# Patient Record
Sex: Female | Born: 1950 | Race: White | Hispanic: No | Marital: Married | Smoking: Never smoker
Health system: Southern US, Community
[De-identification: ages and names within clinical notes are randomized; demographics above are authoritative.]

## PROBLEM LIST (undated history)

## (undated) DIAGNOSIS — I10 Essential (primary) hypertension: Secondary | ICD-10-CM

## (undated) DIAGNOSIS — K219 Gastro-esophageal reflux disease without esophagitis: Secondary | ICD-10-CM

## (undated) DIAGNOSIS — E079 Disorder of thyroid, unspecified: Secondary | ICD-10-CM

## (undated) DIAGNOSIS — F419 Anxiety disorder, unspecified: Secondary | ICD-10-CM

## (undated) HISTORY — PX: KNEE SURGERY: SHX244

## (undated) HISTORY — PX: CHOLECYSTECTOMY: SHX55

---

## 2017-08-22 ENCOUNTER — Other Ambulatory Visit: Payer: Self-pay | Admitting: Internal Medicine

## 2017-08-22 DIAGNOSIS — R921 Mammographic calcification found on diagnostic imaging of breast: Secondary | ICD-10-CM

## 2017-08-29 ENCOUNTER — Ambulatory Visit
Admission: RE | Admit: 2017-08-29 | Discharge: 2017-08-29 | Disposition: A | Discharge status: Home / Self Care | Payer: Medicare Other | Source: Ambulatory Visit | Attending: Internal Medicine | Admitting: Internal Medicine

## 2017-08-29 DIAGNOSIS — R921 Mammographic calcification found on diagnostic imaging of breast: Secondary | ICD-10-CM

## 2017-08-30 ENCOUNTER — Telehealth: Payer: Self-pay | Admitting: *Deleted

## 2017-08-30 ENCOUNTER — Encounter: Payer: Self-pay | Admitting: Internal Medicine

## 2017-08-30 NOTE — Telephone Encounter (Signed)
-----   Message from Inez CatalinaEmily B Mullen, MD sent at 08/30/2017  3:16 PM EST ----- Myriam JacobsonHelen - - Epic seems to indicate that [facility].    This result came to us inappropriately as this patient has not seen us.  I would like to make sure her PCP gets the result faxed to them.  Can you find out if it faxed and if not, fax again and confirm?    Thank you!

## 2017-08-30 NOTE — Telephone Encounter (Signed)
Called [facility], lm for a return call or to notify radiology for resending results, left [facility] triage ph# so [facility] could fax results to them.

## 2017-09-04 ENCOUNTER — Other Ambulatory Visit: Payer: Self-pay | Admitting: Internal Medicine

## 2017-09-04 NOTE — Telephone Encounter (Signed)
I have called [facility] once again and the person I spoke w/, mckinley states that they have not rec'd the report, she was given breast center phone number after stating they would need the report directly from the breast ctr Also notified the breast ctr and spoke to [place] who will be looking into the matter

## 2017-09-04 NOTE — Telephone Encounter (Signed)
-----   Message from Emily B Mullen, MD sent at 08/30/2017  3:16 PM EST ----- Helen - - Epic seems to indicate that this faxed automatically to [facility].    This result came to us inappropriately as this patient has not seen us.  I would like to make sure her PCP gets the result faxed to them.  Can you find out if it faxed and if not, fax again and confirm?    Thank you! 

## 2017-09-06 NOTE — Telephone Encounter (Signed)
Awesome, thanks!  I am not sure why it was sent to us, but appreciate you following up on that.

## 2018-10-25 IMAGING — MG MM CLIP PLACEMENT
6 series · 6 of 14 positions shown · non-contrast
Comparison: Previous exam(s).

CLINICAL DATA: Status post stereotactic biopsy of right breast
calcifications.

EXAM:
DIAGNOSTIC RIGHT MAMMOGRAM POST STEREOTACTIC BIOPSY

[R CC]
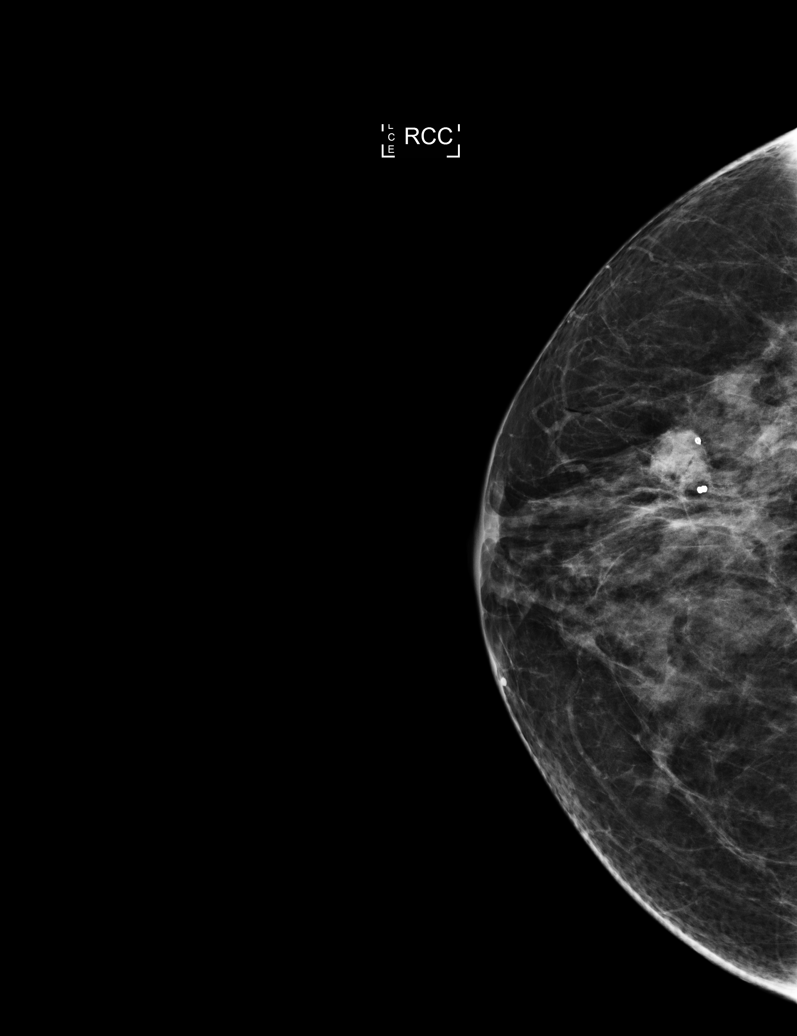

[R ML synth-2D]
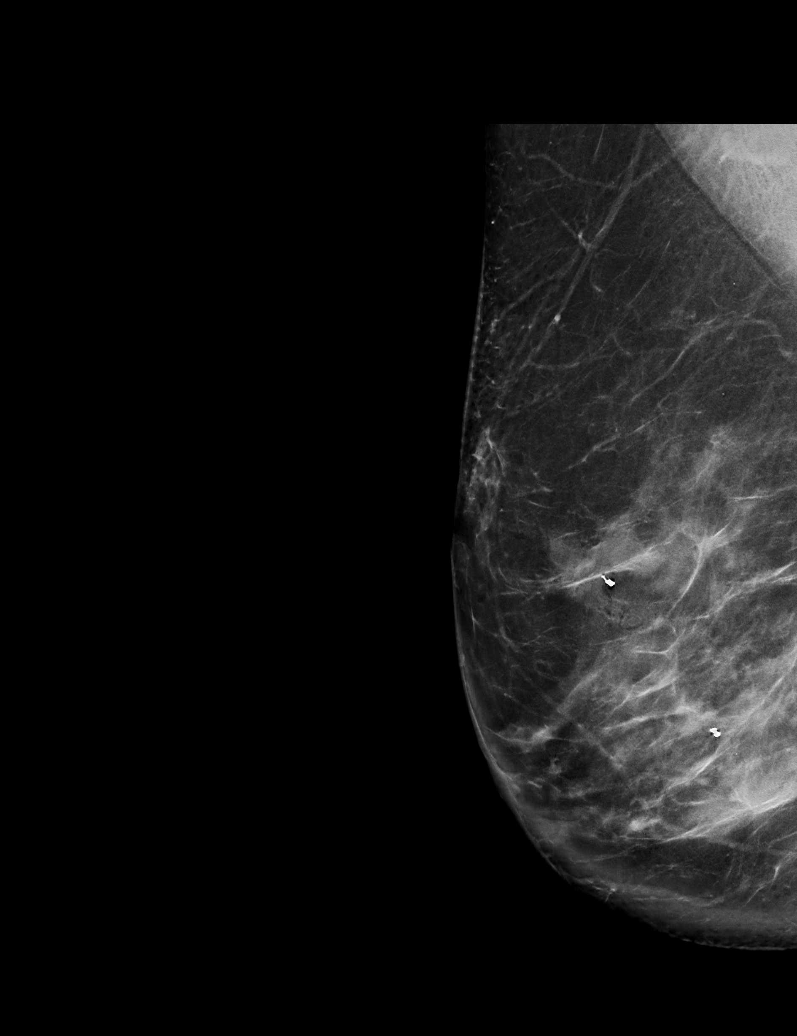

[R ML]
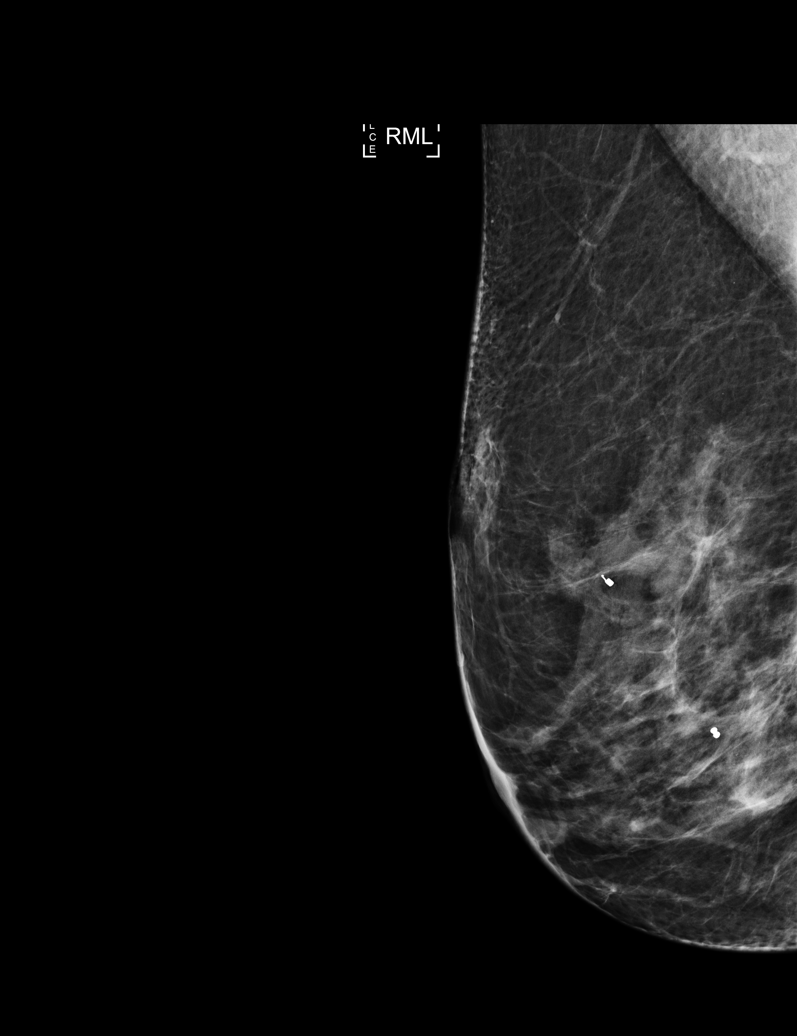

[R CC synth-2D]
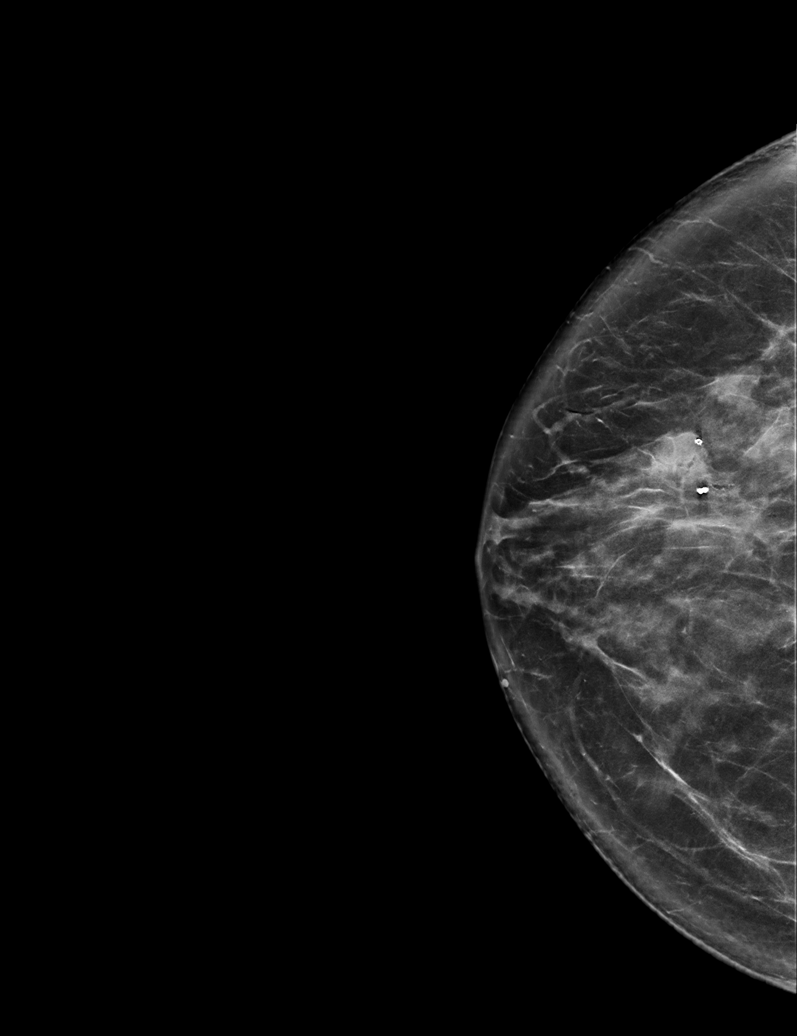

[R ML tomo · tomo slice 47/94.0]
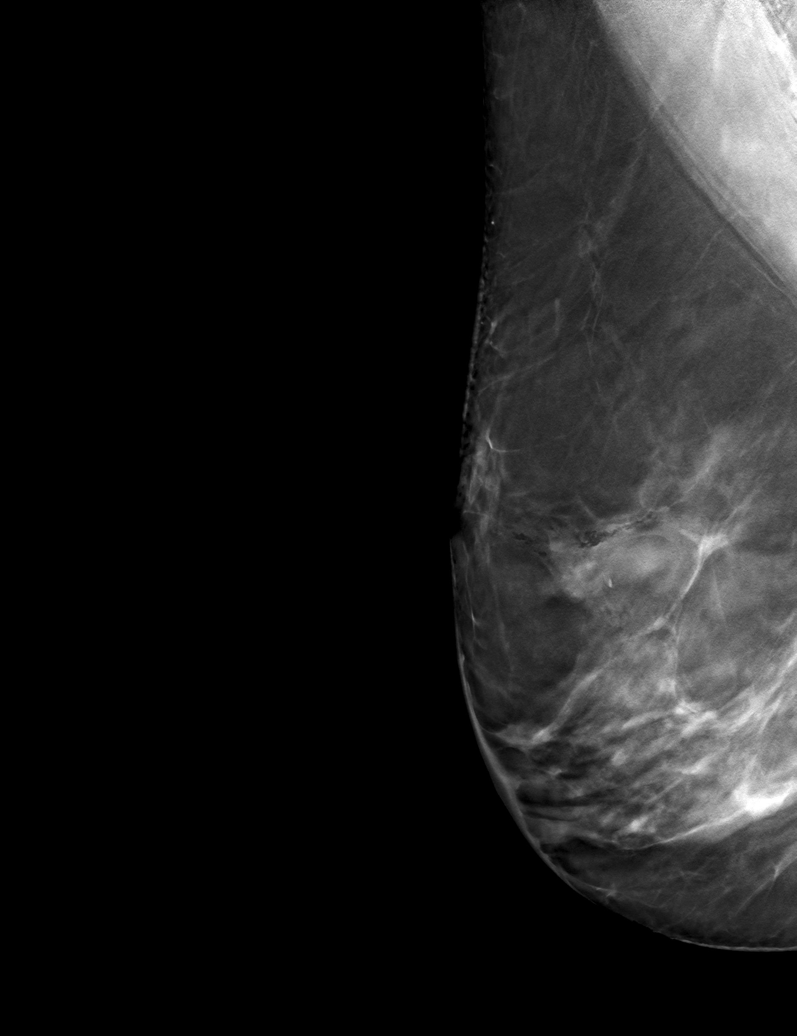

[R CC tomo · tomo slice 39/77.0]
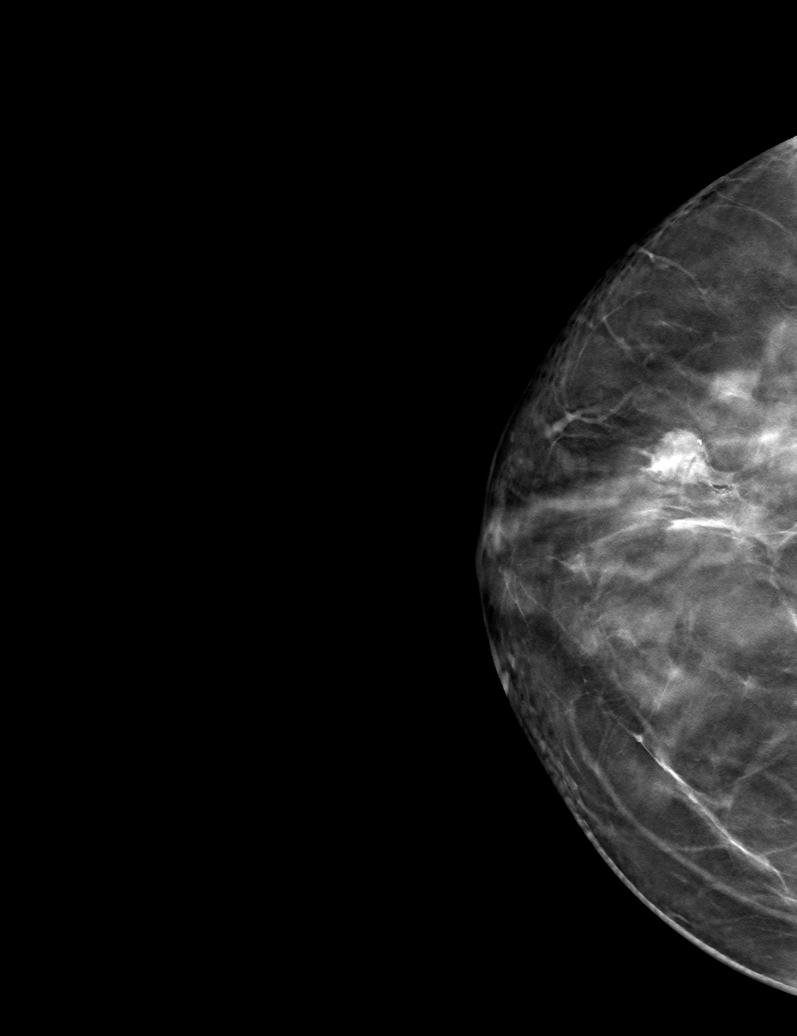

[6 of 14 positions shown; findings below may reference images not displayed]

FINDINGS: Mammographic images were obtained following stereotactic guided
biopsy of calcifications within the upper-outer quadrant of the
right breast. Coil shaped biopsy clip is well positioned.
IMPRESSION: Coil shaped biopsy clip well-positioned at the site of the targeted
calcifications in the upper-outer quadrant of the right breast.

Additional dumbbell-shaped clip within the inferior right breast
corresponds to the site of a previous biopsy revealing ALH.

Final Assessment: Post Procedure Mammograms for Marker Placement

## 2019-10-09 ENCOUNTER — Ambulatory Visit: Payer: Medicare Other | Attending: Internal Medicine

## 2019-10-09 DIAGNOSIS — Z23 Encounter for immunization: Secondary | ICD-10-CM

## 2019-10-09 NOTE — Progress Notes (Signed)
   Covid-19 Vaccination Clinic  Name:  Zahlia Deshazer    MRN: [id number] DOB: 20-Jan-1951  10/09/2019  Ms. Mink was observed post Covid-19 immunization for 15 minutes without incidence. She was provided with Vaccine Information Sheet and instruction to access the V-Safe system.   Ms. Ancona was instructed to call 911 with any severe reactions post vaccine: Marland Kitchen Difficulty breathing  . Swelling of your face and throat  . A fast heartbeat  . A bad rash all over your body  . Dizziness and weakness    Immunizations Administered    Name Date Dose VIS Date Route   Pfizer COVID-19 Vaccine 10/09/2019  8:57 AM 0.3 mL 08/07/2019 Intramuscular   Manufacturer: [employer], [employer]   Lot: [id number]   NDC: [address]-0

## 2019-10-31 ENCOUNTER — Ambulatory Visit: Payer: Medicare Other | Attending: Internal Medicine

## 2019-10-31 DIAGNOSIS — Z23 Encounter for immunization: Secondary | ICD-10-CM | POA: Insufficient documentation

## 2019-10-31 NOTE — Progress Notes (Signed)
   Covid-19 Vaccination Clinic  Name:  Stacy Kramer    MRN: [id number] DOB: Jan 16, 1951  10/31/2019  [name] was observed post Covid-19 immunization for 15 minutes without incident. She was provided with Vaccine Information Sheet and instruction to access the V-Safe system.   [name] was instructed to call 911 with any severe reactions post vaccine: Marland Kitchen Difficulty breathing  . Swelling of face and throat  . A fast heartbeat  . A bad rash all over body  . Dizziness and weakness   Immunizations Administered    Name Date Dose VIS Date Route   Pfizer COVID-19 Vaccine 10/31/2019  2:40 PM 0.3 mL 08/07/2019 Intramuscular   Manufacturer: [employer], [employer]   Lot: [id number]   NDC: [address]-7

## 2022-10-21 ENCOUNTER — Other Ambulatory Visit: Payer: Self-pay

## 2022-10-21 ENCOUNTER — Emergency Department (HOSPITAL_BASED_OUTPATIENT_CLINIC_OR_DEPARTMENT_OTHER)
Admission: EM | Admit: 2022-10-21 | Discharge: 2022-10-22 | Disposition: A | Discharge status: Home / Self Care | Payer: Medicare Other | Attending: Emergency Medicine | Admitting: Emergency Medicine

## 2022-10-21 DIAGNOSIS — R1013 Epigastric pain: Secondary | ICD-10-CM | POA: Insufficient documentation

## 2022-10-21 DIAGNOSIS — I10 Essential (primary) hypertension: Secondary | ICD-10-CM | POA: Diagnosis not present

## 2022-10-21 LAB — CBC
HCT: 38.2 % (ref 36.0–46.0)
Hemoglobin: 13.3 g/dL (ref 12.0–15.0)
MCH: 31.7 pg (ref 26.0–34.0)
MCHC: 34.8 g/dL (ref 30.0–36.0)
MCV: 91 fL (ref 80.0–100.0)
Platelets: 325 10*3/uL (ref 150–400)
RBC: 4.2 MIL/uL (ref 3.87–5.11)
RDW: 11.9 % (ref 11.5–15.5)
WBC: 10.4 10*3/uL (ref 4.0–10.5)
nRBC: 0 % (ref 0.0–0.2)

## 2022-10-21 LAB — URINALYSIS, ROUTINE W REFLEX MICROSCOPIC
Bilirubin Urine: NEGATIVE
Glucose, UA: NEGATIVE mg/dL
Ketones, ur: NEGATIVE mg/dL
Nitrite: NEGATIVE
Protein, ur: NEGATIVE mg/dL
Specific Gravity, Urine: 1.01 (ref 1.005–1.030)
pH: 7 (ref 5.0–8.0)

## 2022-10-21 LAB — COMPREHENSIVE METABOLIC PANEL
ALT: 14 U/L (ref 0–44)
AST: 19 U/L (ref 15–41)
Albumin: 4.4 g/dL (ref 3.5–5.0)
Alkaline Phosphatase: 55 U/L (ref 38–126)
Anion gap: 8 (ref 5–15)
BUN: 9 mg/dL (ref 8–23)
CO2: 24 mmol/L (ref 22–32)
Calcium: 9.3 mg/dL (ref 8.9–10.3)
Chloride: 100 mmol/L (ref 98–111)
Creatinine, Ser: 0.82 mg/dL (ref 0.44–1.00)
GFR, Estimated: >60 mL/min (ref >60)
Glucose, Bld: 97 mg/dL (ref 70–99)
Potassium: 3.7 mmol/L (ref 3.5–5.1)
Sodium: 132 mmol/L — ABNORMAL LOW (ref 135–145)
Total Bilirubin: 0.8 mg/dL (ref 0.3–1.2)
Total Protein: 7.4 g/dL (ref 6.5–8.1)

## 2022-10-21 LAB — URINALYSIS, MICROSCOPIC (REFLEX)

## 2022-10-21 LAB — TROPONIN I (HIGH SENSITIVITY): Troponin I (High Sensitivity): 3 ng/L (ref <18)

## 2022-10-21 LAB — LIPASE, BLOOD: Lipase: 28 U/L (ref 11–51)

## 2022-10-21 MED ORDER — PANTOPRAZOLE SODIUM 40 MG PO TBEC
40.0000 mg | DELAYED_RELEASE_TABLET | Freq: Once | ORAL | Status: AC
  Administered 2022-10-21: 40 mg via ORAL
  Filled 2022-10-21: qty 1

## 2022-10-21 MED ORDER — ALUM & MAG HYDROXIDE-SIMETH 200-200-20 MG/5ML PO SUSP
30.0000 mL | Freq: Once | ORAL | Status: AC
  Administered 2022-10-21: 30 mL via ORAL
  Filled 2022-10-21: qty 30

## 2022-10-21 MED ORDER — PANTOPRAZOLE SODIUM 20 MG PO TBEC: 40.0000 mg | DELAYED_RELEASE_TABLET | Freq: Two times a day (BID) | ORAL | 0 refills | Status: AC

## 2022-10-21 NOTE — ED Provider Notes (Signed)
[facility] Provider Note   CSN: [id number] Arrival date & time: 10/21/22  1824     History {Add pertinent medical, surgical, social history, OB history to HPI:1} Chief Complaint  Patient presents with   Abdominal Pain    Stacy Kramer is a 72 y.o. female.  HPI    Hypertension, thyroid, anxiety   Hx of gastritis, flaring Tried Pepcid complete helped at first, today it didn't help at all Gas ex total relief  About one week ago symptoms began Tried to eat before taking gabapentin and had toast and banana and it really flared up, burning epigastric pain, worse after eating. One time it did radiate to back.   No chest pain or dyspnea No nausea, vomiting diarrhea or constipation Prone to constipation but not having it now No urinary pain, cough, no fevers or chills Now pain is 4/10  No hx of abdominal surgery    *** The histories are not reviewed yet. Please review them in the "History" navigator section and refresh this [place].   No past medical history on file.  Home Medications Prior to Admission medications   Not on File      Allergies    Codeine    Review of Systems   Review of Systems  Physical Exam Updated Vital Signs BP (!) 166/90 (BP Location: Left Arm)   Pulse 69   Temp 99.1 F (37.3 C) (Oral)   Resp 16   Wt 57.2 kg   SpO2 98%  Physical Exam  ED Results / Procedures / Treatments   Labs (all labs ordered are listed, but only abnormal results are displayed) Labs Reviewed  COMPREHENSIVE METABOLIC PANEL - Abnormal; Notable for the following components:      Result Value   Sodium 132 (*)    All other components within normal limits  URINALYSIS, ROUTINE W REFLEX MICROSCOPIC - Abnormal; Notable for the following components:   Color, Urine STRAW (*)    Hgb urine dipstick SMALL (*)    Leukocytes,Ua TRACE (*)    All other components within normal limits  URINALYSIS, MICROSCOPIC (REFLEX) -  Abnormal; Notable for the following components:   Bacteria, UA MANY (*)    Non Squamous Epithelial PRESENT (*)    All other components within normal limits  LIPASE, BLOOD  CBC    EKG EKG Interpretation  Date/Time:  Sunday October 21 2022 18:38:49 EST Ventricular Rate:  73 PR Interval:  154 QRS Duration: 90 QT Interval:  370 QTC Calculation: 408 R Axis:   37 Text Interpretation: Sinus rhythm Borderline T wave abnormalities No previous ECGs available Reconfirmed by Gareth Morgan [phone]) on 10/21/2022 7:36:42 PM  Radiology No results found.  Procedures Procedures  {Document cardiac monitor, telemetry assessment procedure when appropriate:1}  Medications Ordered in ED Medications - No data to display  ED Course/ Medical Decision Making/ A&P   {   Click here for ABCD2, HEART and other calculatorsREFRESH Note before signing :1}                          Medical Decision Making Amount and/or Complexity of Data Reviewed Labs: ordered.   ***  {Document critical care time when appropriate:1} {Document review of labs and clinical decision tools ie heart score, Chads2Vasc2 etc:1}  {Document your independent review of radiology images, and any outside records:1} {Document your discussion with family members, caretakers, and with consultants:1} {Document social determinants of health affecting  pt's care:1} {Document your decision making why or why not admission, treatments were needed:1} Final Clinical Impression(s) / ED Diagnoses Final diagnoses:  None    Rx / DC Orders ED Discharge Orders     None

## 2022-10-21 NOTE — ED Triage Notes (Signed)
Pt arrives with c/o ABD pain that started about 5 days ago. Per pt, pain is in her epigastric area and that she has a hx of gastritis. Pt denies CP, SOB, and n/v.

## 2022-11-02 ENCOUNTER — Other Ambulatory Visit: Payer: Self-pay | Admitting: Orthopedic Surgery

## 2022-11-02 DIAGNOSIS — M4316 Spondylolisthesis, lumbar region: Secondary | ICD-10-CM

## 2022-11-02 DIAGNOSIS — M48062 Spinal stenosis, lumbar region with neurogenic claudication: Secondary | ICD-10-CM

## 2022-11-28 ENCOUNTER — Ambulatory Visit
Admission: RE | Admit: 2022-11-28 | Discharge: 2022-11-28 | Disposition: A | Discharge status: Home / Self Care | Payer: Medicare Other | Source: Ambulatory Visit | Attending: Orthopedic Surgery | Admitting: Orthopedic Surgery

## 2022-11-28 DIAGNOSIS — M48062 Spinal stenosis, lumbar region with neurogenic claudication: Secondary | ICD-10-CM

## 2022-11-28 DIAGNOSIS — M4316 Spondylolisthesis, lumbar region: Secondary | ICD-10-CM

## 2022-12-05 ENCOUNTER — Other Ambulatory Visit: Payer: Self-pay | Admitting: Orthopedic Surgery

## 2022-12-05 DIAGNOSIS — M50122 Cervical disc disorder at C5-C6 level with radiculopathy: Secondary | ICD-10-CM

## 2022-12-05 DIAGNOSIS — M50123 Cervical disc disorder at C6-C7 level with radiculopathy: Secondary | ICD-10-CM

## 2023-01-01 ENCOUNTER — Ambulatory Visit
Admission: RE | Admit: 2023-01-01 | Discharge: 2023-01-01 | Disposition: A | Discharge status: Home / Self Care | Payer: Medicare Other | Source: Ambulatory Visit | Attending: Orthopedic Surgery | Admitting: Orthopedic Surgery

## 2023-01-01 DIAGNOSIS — M50122 Cervical disc disorder at C5-C6 level with radiculopathy: Secondary | ICD-10-CM

## 2023-01-01 DIAGNOSIS — M50123 Cervical disc disorder at C6-C7 level with radiculopathy: Secondary | ICD-10-CM

## 2023-01-13 ENCOUNTER — Emergency Department (HOSPITAL_BASED_OUTPATIENT_CLINIC_OR_DEPARTMENT_OTHER)
Admission: EM | Admit: 2023-01-13 | Discharge: 2023-01-14 | Disposition: A | Discharge status: Home / Self Care | Payer: Medicare Other | Attending: Emergency Medicine | Admitting: Emergency Medicine

## 2023-01-13 ENCOUNTER — Other Ambulatory Visit: Payer: Self-pay

## 2023-01-13 ENCOUNTER — Encounter (HOSPITAL_BASED_OUTPATIENT_CLINIC_OR_DEPARTMENT_OTHER): Payer: Self-pay

## 2023-01-13 ENCOUNTER — Emergency Department (HOSPITAL_BASED_OUTPATIENT_CLINIC_OR_DEPARTMENT_OTHER): Payer: Medicare Other

## 2023-01-13 DIAGNOSIS — R109 Unspecified abdominal pain: Secondary | ICD-10-CM | POA: Insufficient documentation

## 2023-01-13 DIAGNOSIS — K5903 Drug induced constipation: Secondary | ICD-10-CM

## 2023-01-13 DIAGNOSIS — I10 Essential (primary) hypertension: Secondary | ICD-10-CM | POA: Insufficient documentation

## 2023-01-13 DIAGNOSIS — K5900 Constipation, unspecified: Secondary | ICD-10-CM | POA: Insufficient documentation

## 2023-01-13 HISTORY — DX: Disorder of thyroid, unspecified: E07.9

## 2023-01-13 HISTORY — DX: Anxiety disorder, unspecified: F41.9

## 2023-01-13 HISTORY — DX: Gastro-esophageal reflux disease without esophagitis: K21.9

## 2023-01-13 HISTORY — DX: Essential (primary) hypertension: I10

## 2023-01-13 LAB — COMPREHENSIVE METABOLIC PANEL
ALT: 31 U/L (ref 0–44)
AST: 44 U/L — ABNORMAL HIGH (ref 15–41)
Albumin: 4.4 g/dL (ref 3.5–5.0)
Alkaline Phosphatase: 59 U/L (ref 38–126)
Anion gap: 11 (ref 5–15)
BUN: 10 mg/dL (ref 8–23)
CO2: 26 mmol/L (ref 22–32)
Calcium: 9.2 mg/dL (ref 8.9–10.3)
Chloride: 94 mmol/L — ABNORMAL LOW (ref 98–111)
Creatinine, Ser: 0.77 mg/dL (ref 0.44–1.00)
GFR, Estimated: >60 mL/min (ref >60)
Glucose, Bld: 121 mg/dL — ABNORMAL HIGH (ref 70–99)
Potassium: 3.8 mmol/L (ref 3.5–5.1)
Sodium: 131 mmol/L — ABNORMAL LOW (ref 135–145)
Total Bilirubin: 0.6 mg/dL (ref 0.3–1.2)
Total Protein: 7.2 g/dL (ref 6.5–8.1)

## 2023-01-13 LAB — LIPASE, BLOOD: Lipase: 30 U/L (ref 11–51)

## 2023-01-13 LAB — CBC
HCT: 37 % (ref 36.0–46.0)
Hemoglobin: 12.8 g/dL (ref 12.0–15.0)
MCH: 31.7 pg (ref 26.0–34.0)
MCHC: 34.6 g/dL (ref 30.0–36.0)
MCV: 91.6 fL (ref 80.0–100.0)
Platelets: 265 10*3/uL (ref 150–400)
RBC: 4.04 MIL/uL (ref 3.87–5.11)
RDW: 12.1 % (ref 11.5–15.5)
WBC: 9.1 10*3/uL (ref 4.0–10.5)
nRBC: 0 % (ref 0.0–0.2)

## 2023-01-13 MED ORDER — IOHEXOL 300 MG/ML  SOLN
75.0000 mL | Freq: Once | INTRAMUSCULAR | Status: AC | PRN
  Administered 2023-01-13: 75 mL via INTRAVENOUS

## 2023-01-13 NOTE — ED Provider Notes (Signed)
  [facility] Provider Note   CSN: [id number] Arrival date & time: 01/13/23  2200     History {Add pertinent medical, surgical, social history, OB history to HPI:1} Chief Complaint  Patient presents with   Constipation    Stacy Kramer is a 72 y.o. female.   Constipation      Home Medications Prior to Admission medications   Medication Sig Start Date End Date Taking? Authorizing Provider  pantoprazole (PROTONIX) 20 MG tablet Take 2 tablets (40 mg total) by mouth 2 (two) times daily before a meal for 14 days. 10/21/22 11/04/22  Alvira Monday, MD      Allergies    Codeine    Review of Systems   Review of Systems  Gastrointestinal:  Positive for constipation.    Physical Exam Updated Vital Signs BP (!) 142/95 (BP Location: Right Arm)   Pulse 80   Temp 98.2 F (36.8 C) (Oral)   Resp 16   Ht 5\' 1"  (1.549 m)   Wt 52.2 kg   SpO2 99%   BMI 21.73 kg/m  Physical Exam  ED Results / Procedures / Treatments   Labs (all labs ordered are listed, but only abnormal results are displayed) Labs Reviewed  COMPREHENSIVE METABOLIC PANEL - Abnormal; Notable for the following components:      Result Value   Sodium 131 (*)    Chloride 94 (*)    Glucose, Bld 121 (*)    AST 44 (*)    All other components within normal limits  CBC  LIPASE, BLOOD  URINALYSIS, ROUTINE W REFLEX MICROSCOPIC    EKG None  Radiology No results found.  Procedures Procedures  {Document cardiac monitor, telemetry assessment procedure when appropriate:1}  Medications Ordered in ED Medications - No data to display  ED Course/ Medical Decision Making/ A&P   {   Click here for ABCD2, HEART and other calculatorsREFRESH Note before signing :1}                          Medical Decision Making      Final Clinical Impression(s) / ED Diagnoses Final diagnoses:  None   Return for intractable cough, coughing up blood, fevers > 100.4  unrelieved by medication, shortness of breath, intractable vomiting, chest pain, shortness of breath, weakness, numbness, changes in speech, facial asymmetry, abdominal pain, passing out, Inability to tolerate liquids or food, cough, altered mental status or any concerns. No signs of systemic illness or infection. The patient is nontoxic-appearing on exam and vital signs are within normal limits.  I have reviewed the triage vital signs and the nursing notes. Pertinent labs & imaging results that were available during my care of the patient were reviewed by me and considered in my medical decision making (see chart for details). After history, exam, and medical workup I feel the patient has been appropriately medically screened and is safe for discharge home. Pertinent diagnoses were discussed with the patient. Patient was given return precautions.  Rx / DC Orders ED Discharge Orders     None

## 2023-01-13 NOTE — ED Triage Notes (Addendum)
Pt had a cholecystomy on Wednesday and took Percocet for pain (2 pills) and is now constipated. Pt had last full BM on Tuesday before surgery. Pt reports constipation is so bad she feels like vomiting sometimes. Pt is afraid to vomit or to strain due to surgery. Pt with 5/10 rectal pain.

## 2023-01-14 MED ORDER — MILK AND MOLASSES ENEMA: 1.0000 | Freq: Once | RECTAL | Status: DC

## 2023-01-14 MED ORDER — NITROGLYCERIN 2 % TD OINT
0.5000 [in_us] | TOPICAL_OINTMENT | Freq: Once | TRANSDERMAL | Status: DC
  Filled 2023-01-14: qty 1

## 2023-01-14 MED ORDER — ACETAMINOPHEN 500 MG PO TABS
1000.0000 mg | ORAL_TABLET | Freq: Once | ORAL | Status: AC
  Administered 2023-01-14: 1000 mg via ORAL
  Filled 2023-01-14: qty 2

## 2023-01-14 NOTE — ED Notes (Signed)
Pt had large BM and states that she feels much better. Pt reports some discomfort to her rectum from all of the wiping. Pt cleaned up and room cleaned of stool and chucks pads. Peri care provided.

## 2023-01-14 NOTE — ED Notes (Signed)
Pt had small bowel movement with some clay like consistency stool balls after initial of soap suds inserted into rectum. Pt has more soap suds enema in rectum and attempting to hold for another 10-15 minutes. Will call out when she is ready to use the bedside commode.

## 2023-01-14 NOTE — ED Notes (Signed)
Pt had large amount of stool in bedside commode. Pt reports she feels better, but she still feels like she has to have another BM.

## 2023-01-14 NOTE — Discharge Instructions (Signed)
Miralax 3 times daily for 3 days then daily
